# Patient Record
Sex: Female | Born: 2000
Health system: Southern US, Community
[De-identification: ages and names within clinical notes are randomized; demographics above are authoritative.]

---
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
## 2021-01-06 DIAGNOSIS — L03042 Acute lymphangitis of left toe: Secondary | ICD-10-CM | POA: Diagnosis not present

## 2021-01-13 DIAGNOSIS — L03042 Acute lymphangitis of left toe: Secondary | ICD-10-CM | POA: Diagnosis not present

## 2021-02-05 DIAGNOSIS — L600 Ingrowing nail: Secondary | ICD-10-CM | POA: Diagnosis not present

## 2021-02-05 DIAGNOSIS — M79676 Pain in unspecified toe(s): Secondary | ICD-10-CM | POA: Diagnosis not present

## 2021-02-19 DIAGNOSIS — M79676 Pain in unspecified toe(s): Secondary | ICD-10-CM | POA: Diagnosis not present

## 2021-03-10 DIAGNOSIS — K5900 Constipation, unspecified: Secondary | ICD-10-CM | POA: Diagnosis not present

## 2021-03-11 ENCOUNTER — Other Ambulatory Visit: Payer: Self-pay | Admitting: Gastroenterology

## 2021-03-11 ENCOUNTER — Ambulatory Visit
Admission: RE | Admit: 2021-03-11 | Discharge: 2021-03-11 | Disposition: A | Discharge status: Home / Self Care | Payer: Federal, State, Local not specified - PPO | Source: Ambulatory Visit | Attending: Gastroenterology | Admitting: Gastroenterology

## 2021-03-11 DIAGNOSIS — K5900 Constipation, unspecified: Secondary | ICD-10-CM

## 2021-03-11 DIAGNOSIS — R978 Other abnormal tumor markers: Secondary | ICD-10-CM | POA: Diagnosis not present

## 2021-03-14 ENCOUNTER — Other Ambulatory Visit: Payer: Self-pay | Admitting: Gastroenterology

## 2021-03-14 ENCOUNTER — Ambulatory Visit
Admission: RE | Admit: 2021-03-14 | Discharge: 2021-03-14 | Disposition: A | Discharge status: Home / Self Care | Payer: Federal, State, Local not specified - PPO | Source: Ambulatory Visit | Attending: Gastroenterology | Admitting: Gastroenterology

## 2021-03-14 DIAGNOSIS — K5900 Constipation, unspecified: Secondary | ICD-10-CM

## 2021-03-17 ENCOUNTER — Other Ambulatory Visit: Payer: Self-pay | Admitting: Gastroenterology

## 2021-03-17 ENCOUNTER — Ambulatory Visit
Admission: RE | Admit: 2021-03-17 | Discharge: 2021-03-17 | Disposition: A | Discharge status: Home / Self Care | Payer: Federal, State, Local not specified - PPO | Source: Ambulatory Visit | Attending: Gastroenterology | Admitting: Gastroenterology

## 2021-03-17 DIAGNOSIS — K5900 Constipation, unspecified: Secondary | ICD-10-CM

## 2021-05-14 DIAGNOSIS — K589 Irritable bowel syndrome without diarrhea: Secondary | ICD-10-CM | POA: Insufficient documentation

## 2021-09-01 ENCOUNTER — Encounter: Payer: Self-pay | Admitting: Nurse Practitioner

## 2021-09-01 ENCOUNTER — Ambulatory Visit: Payer: Federal, State, Local not specified - PPO | Admitting: Nurse Practitioner

## 2021-09-01 VITALS — BP 110/60 | HR 96 | Temp 98.0°F | Ht 67.0 in | Wt 125.4 lb

## 2021-09-01 DIAGNOSIS — R935 Abnormal findings on diagnostic imaging of other abdominal regions, including retroperitoneum: Secondary | ICD-10-CM | POA: Insufficient documentation

## 2021-09-01 DIAGNOSIS — R1033 Periumbilical pain: Secondary | ICD-10-CM

## 2021-09-01 LAB — CBC WITH DIFFERENTIAL/PLATELET
Basophils Absolute: 0 10*3/uL (ref 0.0–0.1)
Basophils Relative: 0.9 % (ref 0.0–3.0)
Eosinophils Absolute: 0.1 10*3/uL (ref 0.0–0.7)
Eosinophils Relative: 3.3 % (ref 0.0–5.0)
HCT: 40.5 % (ref 36.0–46.0)
Hemoglobin: 13.3 g/dL (ref 12.0–15.0)
Lymphocytes Relative: 29.9 % (ref 12.0–46.0)
Lymphs Abs: 1.3 10*3/uL (ref 0.7–4.0)
MCHC: 33 g/dL (ref 30.0–36.0)
MCV: 87.3 fl (ref 78.0–100.0)
Monocytes Absolute: 0.2 10*3/uL (ref 0.1–1.0)
Monocytes Relative: 5.7 % (ref 3.0–12.0)
Neutro Abs: 2.5 10*3/uL (ref 1.4–7.7)
Neutrophils Relative %: 60.2 % (ref 43.0–77.0)
Platelets: 235 10*3/uL (ref 150.0–400.0)
RBC: 4.64 Mil/uL (ref 3.87–5.11)
RDW: 13.2 % (ref 11.5–14.6)
WBC: 4.2 10*3/uL — ABNORMAL LOW (ref 4.5–10.5)

## 2021-09-01 NOTE — Progress Notes (Signed)
Established Patient Visit  Patient: Alison Terry   DOB: [birth year]   21 y.o. Female  MRN: [id number] Visit Date: 09/01/2021  Subjective:    Chief Complaint  Patient presents with   Office Visit    Hospital F/u from 08/28/21 Bloody stools, abd pain     HPI Abnormal abdominal CT scan Periportal edema per CT completed at [facility] and med [facility] 08/28/2021 She denies any ABD trauma. Has persistent intermittent ABD pain. No related to food intake nor BM. Resolved diarrhea, last BM 08/31/21 (normal, no blood). No fever/chills, no nausea.  Repeat hepatic panel, CBC, hep. A and B. Advised to schedule f/up with [facility]  Periumbilical abdominal pain ABD pain and diarrhea x 2days, then developed blood in stool. This led to an ED visit on 08/28/2021 in [place]. She was a family trip. Her sister developed diarrhea the day before her ED visit, but her symptoms are since resolved. I reviewed ED records today: CBC (WBC 8.5, H/H 12.9/3.8.7, neut 79.8, lymph 13.1), CMP (normal), Urinalysis (normal), HCG-negative, and CT ABD. She has a hx of chronic constipation, unable to tolerate linzess (med discontinued 74months ago), she is under the care of [facility].  Today she reports diarrhea and blood in stool has resolved, but has intermittent ABD pain. Advised to f/up with GI  Reviewed medical, surgical, and social history today  Medications: Outpatient Medications Prior to Visit  Medication Sig   cetirizine (ZYRTEC) 10 MG tablet Take 10 mg by mouth daily.   drospirenone-ethinyl estradiol (YAZ) 3-0.02 MG tablet Take 1 tablet by mouth daily.   PROAIR RESPICLICK 108 (90 Base) MCG/ACT AEPB Inhale into the lungs.   No facility-administered medications prior to visit.   Reviewed past medical and social history.   ROS per HPI above      Objective:  BP 110/60 (BP Location: Right Arm, Patient Position: Sitting, Cuff Size: Small)   Pulse 96   Temp 98 F (36.7  C) (Temporal)   Ht 5\' 7"  (1.702 m)   Wt 125 lb 6.4 oz (56.9 kg)   SpO2 (!) 86%   BMI 19.64 kg/m      Physical Exam Cardiovascular:     Rate and Rhythm: Normal rate and regular rhythm.     Pulses: Normal pulses.     Heart sounds: Normal heart sounds.  Pulmonary:     Effort: Pulmonary effort is normal.     Breath sounds: Normal breath sounds.  Abdominal:     General: Abdomen is flat. Bowel sounds are normal. There is no distension.     Palpations: Abdomen is soft.     Tenderness: There is no abdominal tenderness. There is no right CVA tenderness, left CVA tenderness, guarding or rebound.  Skin:    General: Skin is warm and dry.     Findings: No rash.  Neurological:     Mental Status: She is alert and oriented to person, place, and time.  Psychiatric:        Mood and Affect: Mood normal.        Behavior: Behavior normal.     No results found for any visits on 09/01/21.    Assessment & Plan:    Problem List Items Addressed This Visit       Other   Abnormal abdominal CT scan    Periportal edema per CT completed at [facility] and med [facility] 08/28/2021  She denies any ABD trauma. Has persistent intermittent ABD pain. No related to food intake nor BM. Resolved diarrhea, last BM 08/31/21 (normal, no blood). No fever/chills, no nausea.  Repeat hepatic panel, CBC, hep. A and B. Advised to schedule f/up with [facility] Phys[facility] GI      Relevant Orders   Hepatitis A antibody, IgM   Hepatitis B Core Antibody, IgM   Hepatic function panel   CBC with Differential/Platelet   Periumbilical abdominal pain - Primary    ABD pain and diarrhea x 2days, then developed blood in stool. This led to an ED visit on 08/28/2021 in [place]. She was a family trip. Her sister developed diarrhea the day before her ED visit, but her symptoms are since resolved. I reviewed ED records today: CBC (WBC 8.5, H/H 12.9/3.8.7, neut 79.8, lymph 13.1), CMP (normal), Urinalysis (normal), HCG-negative, and  CT ABD. She has a hx of chronic constipation, unable to tolerate linzess (med discontinued 2months ago), she is under the care of [facility].  Today she reports diarrhea and blood in stool has resolved, but has intermittent ABD pain. Advised to f/up with GI      Relevant Orders   Hepatitis A antibody, IgM   Hepatitis B Core Antibody, IgM   Hepatic function panel   CBC with Differential/Platelet   Return if symptoms worsen or fail to improve.     Alysia Penna, NP

## 2021-09-01 NOTE — Assessment & Plan Note (Addendum)
ABD pain and diarrhea x 2days, then developed blood in stool. This led to an ED visit on 08/28/2021 in [place]. She was a family trip. Her sister developed diarrhea the day before her ED visit, but her symptoms are since resolved. I reviewed ED records today: CBC (WBC 8.5, H/H 12.9/3.8.7, neut 79.8, lymph 13.1), CMP (normal), Urinalysis (normal), HCG-negative, and CT ABD. She has a hx of chronic constipation, unable to tolerate linzess (med discontinued 62months ago), she is under the care of [facility].  Today she reports diarrhea and blood in stool has resolved, but has intermittent ABD pain. Advised to f/up with GI

## 2021-09-01 NOTE — Assessment & Plan Note (Addendum)
Periportal edema per CT completed at [facility] and [facility] 08/28/2021 She denies any ABD trauma. Has persistent intermittent ABD pain. No related to food intake nor BM. Resolved diarrhea, last BM 08/31/21 (normal, no blood). No fever/chills, no nausea.  Repeat hepatic panel, CBC, hep. A and B. Advised to schedule f/up with [facility]

## 2021-09-01 NOTE — Patient Instructions (Signed)
Go to lab Schedule f/up appt with [facility]. Maintain low fat and high fiber diet.

## 2021-09-02 LAB — HEPATIC FUNCTION PANEL
ALT: 10 U/L (ref 0–35)
AST: 14 U/L (ref 0–37)
Albumin: 4.1 g/dL (ref 3.5–5.2)
Alkaline Phosphatase: 32 U/L — ABNORMAL LOW (ref 39–117)
Bilirubin, Direct: 0.1 mg/dL (ref 0.0–0.3)
Total Bilirubin: 0.5 mg/dL (ref 0.2–1.2)
Total Protein: 7 g/dL (ref 6.0–8.3)

## 2021-09-02 LAB — HEPATITIS A ANTIBODY, IGM: Hep A IgM: NONREACTIVE

## 2021-09-02 LAB — HEPATITIS B CORE ANTIBODY, IGM: Hep B C IgM: NONREACTIVE

## 2021-09-10 DIAGNOSIS — R197 Diarrhea, unspecified: Secondary | ICD-10-CM | POA: Diagnosis not present

## 2021-09-10 DIAGNOSIS — K589 Irritable bowel syndrome without diarrhea: Secondary | ICD-10-CM | POA: Diagnosis not present

## 2021-09-10 DIAGNOSIS — K5901 Slow transit constipation: Secondary | ICD-10-CM | POA: Diagnosis not present

## 2021-09-10 DIAGNOSIS — K625 Hemorrhage of anus and rectum: Secondary | ICD-10-CM | POA: Diagnosis not present

## 2021-09-19 DIAGNOSIS — K648 Other hemorrhoids: Secondary | ICD-10-CM | POA: Diagnosis not present

## 2021-09-19 DIAGNOSIS — K625 Hemorrhage of anus and rectum: Secondary | ICD-10-CM | POA: Diagnosis not present

## 2021-09-19 DIAGNOSIS — R194 Change in bowel habit: Secondary | ICD-10-CM | POA: Diagnosis not present

## 2021-09-19 LAB — HM COLONOSCOPY

## 2021-10-01 DIAGNOSIS — R194 Change in bowel habit: Secondary | ICD-10-CM | POA: Diagnosis not present

## 2021-10-16 DIAGNOSIS — L603 Nail dystrophy: Secondary | ICD-10-CM | POA: Diagnosis not present

## 2021-11-04 DIAGNOSIS — N946 Dysmenorrhea, unspecified: Secondary | ICD-10-CM | POA: Diagnosis not present

## 2021-11-04 DIAGNOSIS — Z681 Body mass index (BMI) 19 or less, adult: Secondary | ICD-10-CM | POA: Diagnosis not present

## 2021-11-04 DIAGNOSIS — Z01419 Encounter for gynecological examination (general) (routine) without abnormal findings: Secondary | ICD-10-CM | POA: Diagnosis not present

## 2022-04-20 DIAGNOSIS — K1370 Unspecified lesions of oral mucosa: Secondary | ICD-10-CM | POA: Diagnosis not present

## 2022-06-22 IMAGING — DX DG ABDOMEN 1V
1 series · 1 of 1 positions shown · non-contrast
Comparison: 03/11/2021

CLINICAL DATA: Follow-up Sitz marker study

EXAM:
ABDOMEN - 1 VIEW

[dg abd 1 view]
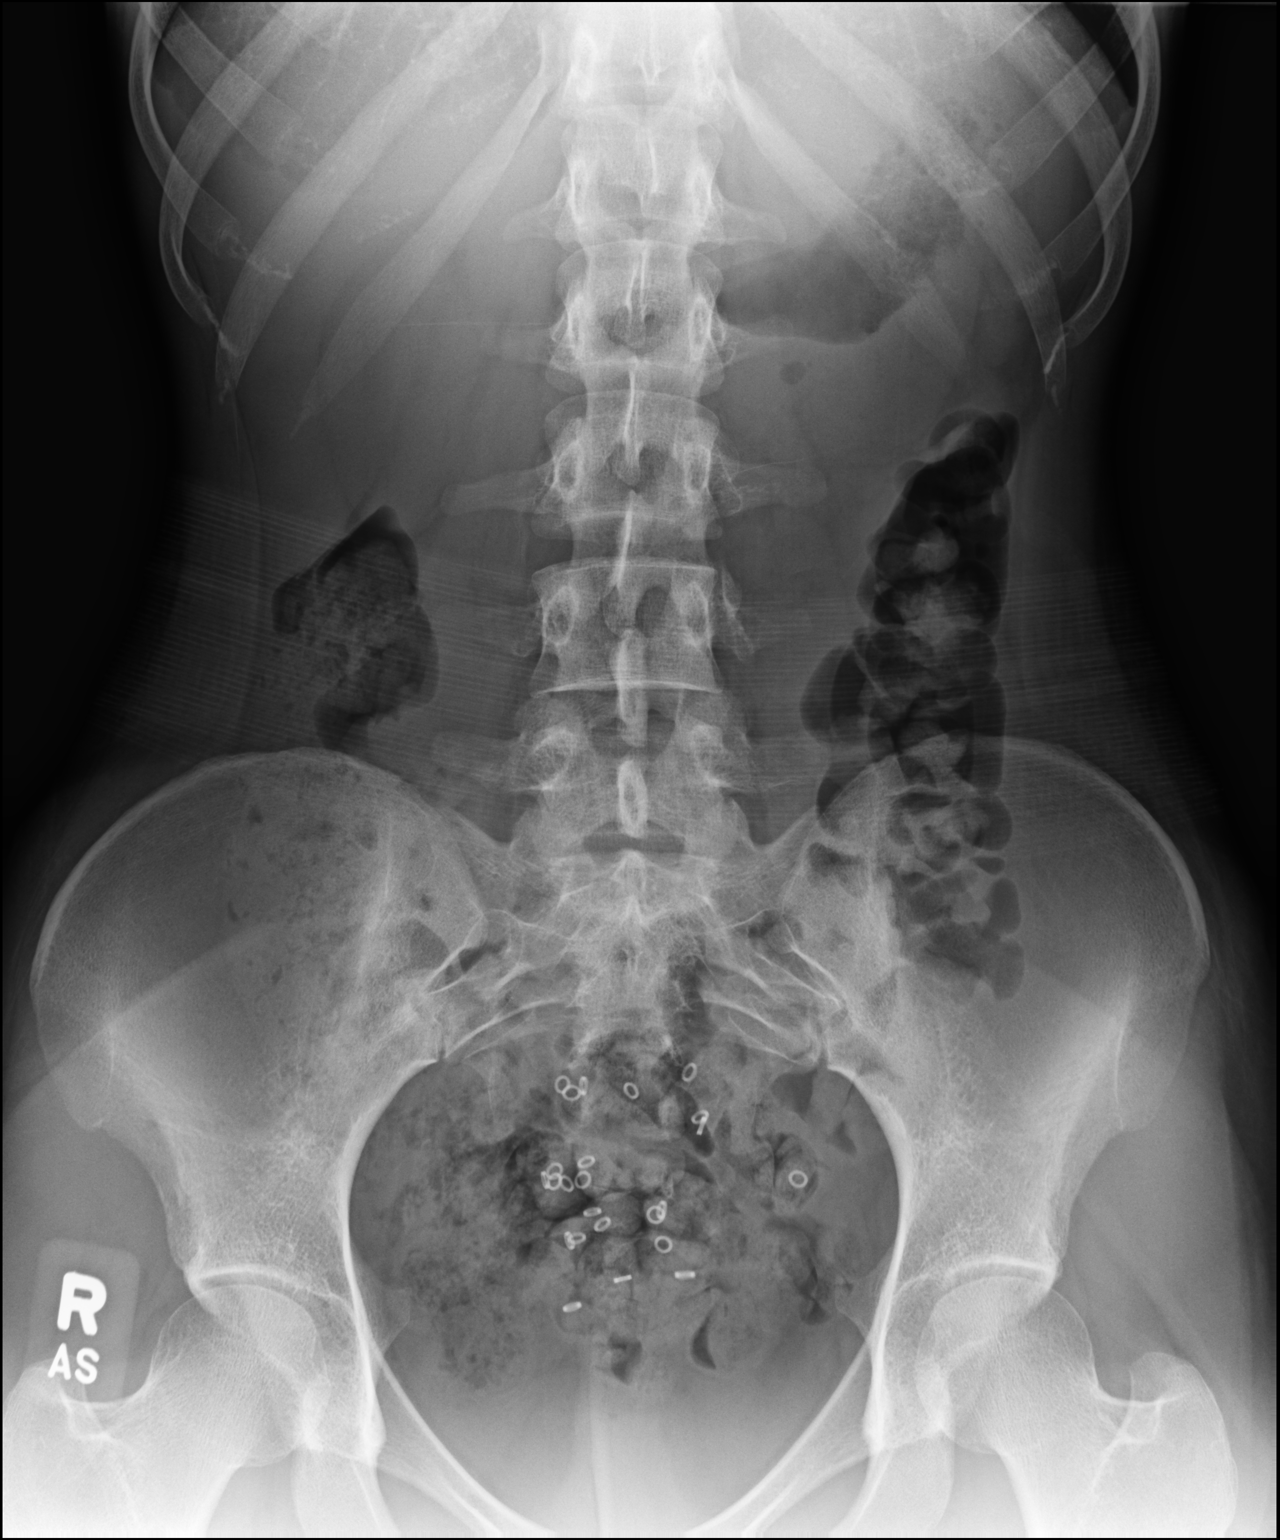

[1 of 1 positions shown; findings below may reference images not displayed]

FINDINGS: Scattered large and small bowel gas is noted. Fecal material is
noted within the colon consistent with a mild degree of
constipation. Twenty-four Sitz markers are noted centrally within
the pelvis likely within the distal colon given their clumping
centrally.
IMPRESSION: Sitz markers are noted centrally within the pelvis likely in the
distal colon. Follow-up as clinically indicated.

Retained fecal material consistent with a degree of constipation.

## 2022-07-16 ENCOUNTER — Ambulatory Visit: Payer: Federal, State, Local not specified - PPO | Admitting: Nurse Practitioner

## 2022-07-16 ENCOUNTER — Encounter: Payer: Self-pay | Admitting: Nurse Practitioner

## 2022-07-16 VITALS — BP 102/60 | HR 100 | Temp 98.4°F | Resp 18 | Ht 67.0 in | Wt 124.8 lb

## 2022-07-16 DIAGNOSIS — Z23 Encounter for immunization: Secondary | ICD-10-CM | POA: Diagnosis not present

## 2022-07-16 DIAGNOSIS — K529 Noninfective gastroenteritis and colitis, unspecified: Secondary | ICD-10-CM | POA: Insufficient documentation

## 2022-07-16 NOTE — Patient Instructions (Addendum)
Go to lab for stool kit Return stool sample to lab as soon as possible Use Peptobismol prn after stool collection Maintain BRAT diet and adequate oral hydration Call office if no improvement in 2weeks  Bland Diet A bland diet may consist of soft foods or foods that are not high in fat or are not greasy, acidic, or spicy. Avoiding certain foods may cause less irritation to your mouth, throat, stomach, or gastrointestinal tract. Avoiding certain foods may make you feel better. Everyone's tolerances are different. A bland diet should be based on what you can tolerate and what may cause discomfort. What is my plan? Your health care provider or dietitian may recommend specific changes to your diet to treat your symptoms. These changes may include: Eating small meals frequently. Cooking food until it is soft enough to chew easily. Taking the time to chew your food thoroughly, so it is easy to swallow and digest. Avoiding foods that cause you discomfort. These may include spicy food, fried food, greasy foods, hard-to-chew foods, or citrus fruits and juices. Drinking slowly. What are tips for following this plan? Reading food labels To reduce fiber intake, look for food labels that say "whole," such as whole wheat or whole grain. Shopping Avoid food items that may have nuts or seeds. Avoid vegetables that may make you gassy or have a tough texture, such as broccoli, cauliflower, or corn. Cooking Cook foods thoroughly so they have a soft texture. Meal planning Make sure you include foods from all food groups to eat a balanced diet. Eat a variety of types of foods. Eat foods and drink beverages that do not cause you discomfort. These may include soups and broths with cooked meats, pasta, and vegetables. Lifestyle Sit up after meals, avoid tight clothing, and take time to eat and chew your food slowly. Ask your health care provider whether you should take dietary supplements. General  information Mildly season your foods. Some seasonings, such as cayenne pepper, vinegar, or hot sauce, may cause irritation. The foods, beverages, or seasonings to avoid should be based on individual tolerance. What foods should I eat? Fruits Canned or cooked fruit such as peaches, pears, or applesauce. Bananas. Vegetables Well-cooked vegetables. Canned or cooked vegetables such as carrots, green beans, beets, or spinach. Mashed or boiled potatoes. Grains  Hot cereals, such as cream of wheat and processed oatmeal. Rice. Bread, crackers, pasta, or tortillas made from refined white flour. Meats and other proteins  Eggs. Creamy peanut butter or other nut butters. Lean, well-cooked tender meats, such as beef, pork, chicken, or fish. Dairy Low-fat dairy products such as milk, cottage cheese, or yogurt. Beverages  Water. Herbal tea. Apple juice. Fats and oils Mild salad dressings. Canola or olive oil. Sweets and desserts Low-fat pudding, custard, or ice cream. Fruit gelatin. The items listed above may not be a complete list of foods and beverages you can eat. Contact a dietitian for more information. What foods should I avoid? Fruits Citrus fruits, such as oranges and grapefruit. Fruits with a stringy texture. Fruits that have lots of seeds, such as kiwi or strawberries. Dried fruits. Vegetables Raw, uncooked vegetables. Salads. Grains Whole grain breads, muffins, and cereals. Meats and other proteins Tough, fibrous meats. Highly seasoned meat such as corned beef, smoked meats, or fish. Processed high-fat meats such as brats, hot dogs, or sausage. Dairy Full-fat dairy foods such as ice cream and cheese. Beverages Caffeinated drinks. Alcohol. Seasonings and condiments Strongly flavored seasonings or condiments. Hot sauce. Salsa. Other foods  Spicy foods. Fried or greasy foods. Sour foods, such as pickled or fermented foods like sauerkraut. Foods high in fiber. The items listed above  may not be a complete list of foods and beverages you should avoid. Contact a dietitian for more information. Summary A bland diet should be based on individual tolerance. It may consist of foods that are soft textured and do not have a lot of fat, fiber, acid, or seasonings. A bland diet may be recommended because avoiding certain foods, beverages, or spices may make you feel better. This information is not intended to replace advice given to you by your health care provider. Make sure you discuss any questions you have with your health care provider. Document Revised: 12/02/2020 Document Reviewed: 12/02/2020 Elsevier Patient Education  2024 [employer].

## 2022-07-16 NOTE — Progress Notes (Signed)
Established Patient Visit  Patient: Alison Terry   DOB: 2000-07-14   21 y.o. Female  MRN: [id number] Visit Date: 07/16/2022  Subjective:    Chief Complaint  Patient presents with   GI issues    Pt says she has had some diarrhea that looked different since Sunday 07/12/22. She has a hx of IBS, but her sxs seem abnormal. The color is yellow and her pain is in the upper region.  Update immunizations   Diarrhea  This is a new problem. The current episode started in the past 7 days. The problem occurs 2 to 4 times per day. The problem has been gradually improving. The stool consistency is described as Watery. The patient states that diarrhea does not awaken her from sleep. Associated symptoms include abdominal pain and bloating. Pertinent negatives include no arthralgias, chills, coughing, fever, headaches, increased  flatus, myalgias, sweats, URI, vomiting or weight loss. Exacerbated by: unknown. She has tried nothing for the symptoms. Her past medical history is significant for irritable bowel syndrome. There is no history of bowel resection, inflammatory bowel disease, malabsorption, a recent abdominal surgery or short gut syndrome.   On Sunday, Stool contained undigested lettuce consumed 2hrs prior. Started with 5stools daily x 2days, now has 1 loose stool daily. Last colonoscopy [facility] 08/2021: normal Completed clindamycin x 7days in March for dental infection She started low FODMAP diet 2days ago. She stopped hyoscyamine tabs  and align probiotic because they were ineffective  Wt Readings from Last 3 Encounters:  07/16/22 124 lb 12.8 oz (56.6 kg)  09/01/21 125 lb 6.4 oz (56.9 kg)  08/31/19 112 lb (50.8 kg) (24 %, Z= -0.71)*   * Growth percentiles are based on CDC (Girls, 2-20 Years) data.    Reviewed medical, surgical, and social history today  Medications: Outpatient Medications Prior to Visit  Medication Sig   cetirizine (ZYRTEC) 10 MG tablet Take 10 mg by  mouth daily.   drospirenone-ethinyl estradiol (YAZ) 3-0.02 MG tablet Take 1 tablet by mouth daily.   [DISCONTINUED] PROAIR RESPICLICK 108 (90 Base) MCG/ACT AEPB Inhale into the lungs.   No facility-administered medications prior to visit.   Reviewed past medical and social history.   ROS per HPI above      Objective:  BP 102/60 (BP Location: Left Arm, Patient Position: Sitting, Cuff Size: Normal)   Pulse 100   Temp 98.4 F (36.9 C) (Temporal)   Resp 18   Ht 5\' 7"  (1.702 m)   Wt 124 lb 12.8 oz (56.6 kg)   SpO2 99%   BMI 19.55 kg/m      Physical Exam Vitals and nursing note reviewed.  Eyes:     General: No scleral icterus. Cardiovascular:     Rate and Rhythm: Normal rate.     Pulses: Normal pulses.  Pulmonary:     Effort: Pulmonary effort is normal.  Abdominal:     General: Bowel sounds are normal. There is no distension.     Palpations: Abdomen is soft.     Tenderness: There is no abdominal tenderness. There is no right CVA tenderness, left CVA tenderness or guarding.  Skin:    General: Skin is warm and dry.     Coloration: Skin is not jaundiced.  Neurological:     Mental Status: She is oriented to person, place, and time.     No results found for any visits on 07/16/22.  Assessment & Plan:    Problem List Items Addressed This Visit       Digestive   Gastroenteritis - Primary   Relevant Orders   C. difficile GDH and Toxin A/B   Other Visit Diagnoses     Immunization due       Relevant Orders   Tdap vaccine greater than or equal to 7yo IM (Completed)     Food poisoning vs viral gastroenteritis vs c.diff? Go to lab for stool kit Return stool sample to lab as soon as possible Use peptobismol prn after stool collection Maintain BRAT diet and adequate oral hydration Call office if no improvement in 2weeks  Return in about 2 months (around 09/15/2022) for CPE (fasting).     Alysia Penna, NP

## 2022-07-17 ENCOUNTER — Other Ambulatory Visit: Payer: Self-pay

## 2022-07-17 ENCOUNTER — Other Ambulatory Visit: Payer: Federal, State, Local not specified - PPO

## 2022-07-17 DIAGNOSIS — K529 Noninfective gastroenteritis and colitis, unspecified: Secondary | ICD-10-CM | POA: Diagnosis not present

## 2022-07-17 DIAGNOSIS — A0472 Enterocolitis due to Clostridium difficile, not specified as recurrent: Secondary | ICD-10-CM

## 2022-07-17 NOTE — Addendum Note (Signed)
Addended by: Lindley Magnus L on: 07/17/2022 03:08 PM   Modules accepted: Orders

## 2022-07-17 NOTE — Addendum Note (Signed)
Addended by: Lindley Magnus L on: 07/17/2022 02:22 PM   Modules accepted: Orders

## 2022-07-20 ENCOUNTER — Telehealth: Payer: Self-pay

## 2022-07-20 LAB — CLOSTRIDIUM DIFFICILE TOXIN B, QUALITATIVE, REAL-TIME PCR: Toxigenic C. Difficile by PCR: DETECTED — AB

## 2022-07-20 LAB — C. DIFFICILE GDH AND TOXIN A/B
GDH ANTIGEN: DETECTED
MICRO NUMBER:: 15112642
SPECIMEN QUALITY:: ADEQUATE
TOXIN A AND B: NOT DETECTED

## 2022-07-20 NOTE — Telephone Encounter (Signed)
[name] called to advise [place] that she is positive for C-Diff

## 2022-07-21 MED ORDER — VANCOMYCIN HCL 125 MG PO CAPS
125.0000 mg | ORAL_CAPSULE | Freq: Four times a day (QID) | ORAL | 0 refills | Status: AC
Start: 2022-07-21 — End: 2022-07-31

## 2022-07-21 NOTE — Addendum Note (Signed)
Addended by: Alysia Penna L on: 07/21/2022 11:50 AM   Modules accepted: Orders

## 2022-08-03 ENCOUNTER — Ambulatory Visit: Payer: Federal, State, Local not specified - PPO | Admitting: Family Medicine

## 2022-08-03 VITALS — BP 110/70 | HR 84 | Temp 98.7°F | Wt 123.4 lb

## 2022-08-03 DIAGNOSIS — A0472 Enterocolitis due to Clostridium difficile, not specified as recurrent: Secondary | ICD-10-CM

## 2022-08-03 MED ORDER — FIDAXOMICIN 200 MG PO TABS
200.0000 mg | ORAL_TABLET | Freq: Two times a day (BID) | ORAL | 0 refills | Status: AC
Start: 2022-08-03 — End: 2022-08-13

## 2022-08-03 MED ORDER — ONDANSETRON HCL 4 MG PO TABS: 4.0000 mg | ORAL_TABLET | Freq: Three times a day (TID) | ORAL | 0 refills | Status: DC | PRN

## 2022-08-03 NOTE — Progress Notes (Unsigned)
Assessment/Plan:   Problem List Items Addressed This Visit   None   There are no discontinued medications.  No follow-ups on file.    Subjective:   Encounter date: 08/03/2022  Alison Terry is a 22 y.o. female who has Menorrhagia with regular cycle; Constipation; Abnormal abdominal CT scan; Periumbilical abdominal pain; IBS (irritable bowel syndrome); and Gastroenteritis on their problem list..   She  has no past medical history on file..   She presents with chief complaint of Medical Management of Chronic Issues (Positve for C-Diff 6/24.  Still having symptoms. Completed vancomycin) .   HPI:   ROS  No past surgical history on file.  Outpatient Medications Prior to Visit  Medication Sig Dispense Refill   cetirizine (ZYRTEC) 10 MG tablet Take 10 mg by mouth daily.     drospirenone-ethinyl estradiol (YAZ) 3-0.02 MG tablet Take 1 tablet by mouth daily.     No facility-administered medications prior to visit.    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   Home with parents   Social Determinants of Health   Financial Resource Strain: Low Risk  (08/03/2022)   Overall Financial Resource Strain (CARDIA)    Difficulty of Paying Living Expenses: Not very hard  Food Insecurity: No Food Insecurity (08/03/2022)   Hunger Vital Sign    Worried About Running  Out of Food in the Last Year: Never true    Ran Out of Food in the Last Year: Never true  Transportation Needs: No Transportation Needs (08/03/2022)   PRAPARE - Administrator, Civil Service (Medical): No    Lack of Transportation (Non-Medical): No  Physical Activity: Insufficiently Active (08/03/2022)   Exercise Vital Sign    Days of Exercise per Week: 3 days    Minutes of Exercise per Session: 20 min  Stress: Stress Concern Present (08/03/2022)   [employer] of Occupational Health - Occupational Stress Questionnaire    Feeling of Stress : Very much  Social Connections: Socially Isolated (08/03/2022)   Social Connection and Isolation Panel [NHANES]    Frequency of Communication with Friends and Family: More than three times a week    Frequency of Social Gatherings with Friends and Family: Once a week    Attends Religious Services: Never    Database administrator or Organizations: No    Attends Engineer, structural: Not on file    Marital Status: Never married  Intimate Partner Violence: Not on file  Objective:  Physical Exam: BP 110/70 (BP Location: Left Arm, Patient Position: Sitting, Cuff Size: Large)   Pulse 84   Temp 98.7 F (37.1 C) (Temporal)   Wt 123 lb 6.4 oz (56 kg)   LMP 07/14/2022   SpO2 99%   BMI 19.33 kg/m   Wt Readings from Last 3 Encounters:  08/03/22 123 lb 6.4 oz (56 kg)  07/16/22 124 lb 12.8 oz (56.6 kg)  09/01/21 125 lb 6.4 oz (56.9 kg)     Physical Exam  No results found.  Recent Results (from the past 2160 hour(s))  C. difficile GDH and Toxin A/B     Status: None   Collection Time: 07/17/22  3:08 PM  Result Value Ref Range   MICRO NUMBER: [id number]    SPECIMEN QUALITY: Adequate    Source STOOL    STATUS: FINAL    GDH ANTIGEN Detected     Comment: Detected   TOXIN A AND B Not Detected    COMMENT      Indeterminate. Specimen  forwarded for toxigenic C. difficile PCR testing. For additional information, please refer to http://education.[facility].com/faq/FAQ136 (This link is being provided for informational/educational purposes only.)  Clostridium difficile Toxin B, Qualitative, Real-Time PCR     Status: Abnormal   Collection Time: 07/17/22  3:08 PM  Result Value Ref Range   Toxigenic C. Difficile by PCR DETECTED (A) NOT DETECTED    Comment: . The stool sample is POSITIVE for toxigenic C. difficile.  This result is suggestive of C. difficile infection  (CDI) if accompanied by appropriate clinical symptoms. . Simultaneous testing does not identify a genetic marker of the hypervirulent 027/NAP1/BI strain of toxigenic  C. difficile. . This test is for use only with liquid or soft stools;  performance characteristics of other clinical specimen  types have not been established. . This assay was performed by Cepheid GeneXpert(R) PCR. The performance characteristics of this assay have been determined by [employer]. Performance characteristics refer to the analytical performance of the test. . For additional information, please refer to http://education.[facility].com/faq/FAQ136 (This link is being provided for informational/educational purposes only.) .         Garner Nash, MD, MS

## 2022-08-03 NOTE — Patient Instructions (Signed)
Start fidaxomicin: Take 1 pill twice a day for 10 days. Hydration: Maintain adequate hydration, especially if diarrhea persists. Zofran: Use as needed for nausea. Contact your gastroenterologist: Inform them about the change in treatment to fidaxomicin and the ongoing symptoms. Monitor Symptoms: If symptoms worsen, such as profuse bleeding, high fever, or severe abdominal pain, go to the emergency department immediately.

## 2022-08-04 DIAGNOSIS — A0472 Enterocolitis due to Clostridium difficile, not specified as recurrent: Secondary | ICD-10-CM | POA: Insufficient documentation

## 2022-08-04 NOTE — Assessment & Plan Note (Signed)
Ongoing symptoms of C. diff infection despite treatment. Persistent diarrhea, cramping, and presence of blood in stool suggest the need for alternative treatment.  Differential diagnosis:  Persistent C. difficile infection Post-infectious irritable bowel syndrome (PI-IBS) Antibiotic-associated colitis Plan:  Initiate treatment with fidaxomicin 200 mg twice daily for 10 days. Hydration is emphasized. Prescribe Zofran for nausea as needed. Advise against the use of antidiarrheals. Follow-up with a gastroenterologist, at [facility]. Provide a note for work delay considering the highly infectious nature of C. diff and potential for severe illness. Educate on recognizing signs of severe complications (profuse bleeding, high fever, severe nausea, and vomiting) and recommend immediate ER visit if these occur.

## 2022-08-20 ENCOUNTER — Encounter: Payer: Federal, State, Local not specified - PPO | Admitting: Nurse Practitioner

## 2022-09-29 DIAGNOSIS — K589 Irritable bowel syndrome without diarrhea: Secondary | ICD-10-CM | POA: Diagnosis not present

## 2022-09-29 DIAGNOSIS — Z8619 Personal history of other infectious and parasitic diseases: Secondary | ICD-10-CM | POA: Diagnosis not present

## 2022-11-10 DIAGNOSIS — Z01419 Encounter for gynecological examination (general) (routine) without abnormal findings: Secondary | ICD-10-CM | POA: Diagnosis not present

## 2022-11-10 DIAGNOSIS — N760 Acute vaginitis: Secondary | ICD-10-CM | POA: Diagnosis not present

## 2022-11-10 DIAGNOSIS — N94810 Vulvar vestibulitis: Secondary | ICD-10-CM | POA: Diagnosis not present

## 2022-11-10 DIAGNOSIS — Z113 Encounter for screening for infections with a predominantly sexual mode of transmission: Secondary | ICD-10-CM | POA: Diagnosis not present

## 2022-11-10 DIAGNOSIS — Z124 Encounter for screening for malignant neoplasm of cervix: Secondary | ICD-10-CM | POA: Diagnosis not present

## 2022-11-27 ENCOUNTER — Telehealth: Payer: Self-pay | Admitting: Nurse Practitioner

## 2022-11-27 NOTE — Telephone Encounter (Signed)
Patient is requesting TOC from [facility] to Dr. Okey Dupre. Please advise.

## 2022-11-30 NOTE — Telephone Encounter (Signed)
Ok

## 2022-12-02 DIAGNOSIS — N94810 Vulvar vestibulitis: Secondary | ICD-10-CM | POA: Diagnosis not present

## 2022-12-08 NOTE — Telephone Encounter (Signed)
Patient would like to reschedule their appointment. The next new patient appointment for Dr. Okey Dupre will be in May. Can this patient be seen sooner? Best callback is [phone].

## 2022-12-14 ENCOUNTER — Ambulatory Visit (INDEPENDENT_AMBULATORY_CARE_PROVIDER_SITE_OTHER): Payer: Federal, State, Local not specified - PPO

## 2022-12-14 ENCOUNTER — Encounter: Payer: Self-pay | Admitting: Internal Medicine

## 2022-12-14 ENCOUNTER — Ambulatory Visit: Payer: Federal, State, Local not specified - PPO | Admitting: Internal Medicine

## 2022-12-14 VITALS — BP 112/66 | HR 89 | Temp 98.8°F | Ht 67.0 in | Wt 125.0 lb

## 2022-12-14 DIAGNOSIS — R197 Diarrhea, unspecified: Secondary | ICD-10-CM | POA: Diagnosis not present

## 2022-12-14 DIAGNOSIS — R5383 Other fatigue: Secondary | ICD-10-CM

## 2022-12-14 DIAGNOSIS — K582 Mixed irritable bowel syndrome: Secondary | ICD-10-CM | POA: Diagnosis not present

## 2022-12-14 DIAGNOSIS — A0472 Enterocolitis due to Clostridium difficile, not specified as recurrent: Secondary | ICD-10-CM

## 2022-12-14 DIAGNOSIS — K5909 Other constipation: Secondary | ICD-10-CM | POA: Diagnosis not present

## 2022-12-14 DIAGNOSIS — R109 Unspecified abdominal pain: Secondary | ICD-10-CM | POA: Diagnosis not present

## 2022-12-14 DIAGNOSIS — R110 Nausea: Secondary | ICD-10-CM | POA: Diagnosis not present

## 2022-12-14 DIAGNOSIS — K5900 Constipation, unspecified: Secondary | ICD-10-CM

## 2022-12-14 LAB — CBC
HCT: 42.2 % (ref 36.0–46.0)
Hemoglobin: 13.7 g/dL (ref 12.0–15.0)
MCHC: 32.5 g/dL (ref 30.0–36.0)
MCV: 87.8 fL (ref 78.0–100.0)
Platelets: 254 10*3/uL (ref 150.0–400.0)
RBC: 4.81 Mil/uL (ref 3.87–5.11)
RDW: 13 % (ref 11.5–15.5)
WBC: 6.3 10*3/uL (ref 4.0–10.5)

## 2022-12-14 LAB — FERRITIN: Ferritin: 14.8 ng/mL (ref 10.0–291.0)

## 2022-12-14 LAB — T4, FREE: Free T4: 1.08 ng/dL (ref 0.60–1.60)

## 2022-12-14 LAB — TSH: TSH: 2.23 u[IU]/mL (ref 0.35–5.50)

## 2022-12-14 NOTE — Progress Notes (Unsigned)
   Subjective:   Patient ID: Alison Terry, female    DOB: 03-22-2000, 22 y.o.   MRN: [id number]  HPI The patient is a 22 YO TOC coming in for multiple concerns mostly GI concerns and fatigue. She had C dif earlier this year and has not recovered well. Saw GI recently and no change to treatment. Pain and bloating and diarrhea still. Has made dietary changes which have not helped. Fatigue is worse in the last year.  PMH, [facility], social history reviewed and updated  Review of Systems  Constitutional:  Positive for fatigue.  HENT: Negative.    Eyes: Negative.   Respiratory:  Negative for cough, chest tightness and shortness of breath.   Cardiovascular:  Negative for chest pain, palpitations and leg swelling.  Gastrointestinal:  Positive for abdominal distention, abdominal pain, constipation and diarrhea. Negative for nausea and vomiting.  Musculoskeletal: Negative.   Skin: Negative.   Neurological: Negative.   Psychiatric/Behavioral: Negative.      Objective:  Physical Exam Constitutional:      Appearance: She is well-developed.  HENT:     Head: Normocephalic and atraumatic.  Cardiovascular:     Rate and Rhythm: Normal rate and regular rhythm.  Pulmonary:     Effort: Pulmonary effort is normal. No respiratory distress.     Breath sounds: Normal breath sounds. No wheezing or rales.  Abdominal:     General: Bowel sounds are normal. There is no distension.     Palpations: Abdomen is soft.     Tenderness: There is abdominal tenderness. There is no rebound.  Musculoskeletal:     Cervical back: Normal range of motion.  Skin:    General: Skin is warm and dry.  Neurological:     Mental Status: She is alert and oriented to person, place, and time.     Coordination: Coordination normal.     Vitals:   12/14/22 1501  BP: 112/66  Pulse: 89  Temp: 98.8 F (37.1 C)  TempSrc: Oral  SpO2: 96%  Weight: 125 lb (56.7 kg)  Height: 5\' 7"  (1.702 m)    Assessment & Plan:  Visit time 25  minutes in face to face communication with patient and coordination of care, additional 20 minutes spent in record review, coordination or care, ordering tests, communicating/referring to other healthcare professionals, documenting in medical records all on the same day of the visit for total time 45 minutes spent on the visit.

## 2022-12-14 NOTE — Patient Instructions (Signed)
We will check the labs and the x-ray today.

## 2022-12-15 DIAGNOSIS — R5383 Other fatigue: Secondary | ICD-10-CM | POA: Insufficient documentation

## 2022-12-15 LAB — VITAMIN D 25 HYDROXY (VIT D DEFICIENCY, FRACTURES): VITD: 15.03 ng/mL — ABNORMAL LOW (ref 30.00–100.00)

## 2022-12-15 LAB — COMPREHENSIVE METABOLIC PANEL
ALT: 14 U/L (ref 0–35)
AST: 18 U/L (ref 0–37)
Albumin: 4.4 g/dL (ref 3.5–5.2)
Alkaline Phosphatase: 37 U/L — ABNORMAL LOW (ref 39–117)
BUN: 10 mg/dL (ref 6–23)
CO2: 21 meq/L (ref 19–32)
Calcium: 9.6 mg/dL (ref 8.4–10.5)
Chloride: 103 meq/L (ref 96–112)
Creatinine, Ser: 0.88 mg/dL (ref 0.40–1.20)
GFR: 93.95 mL/min (ref >60.00)
Glucose, Bld: 100 mg/dL — ABNORMAL HIGH (ref 70–99)
Potassium: 3.8 meq/L (ref 3.5–5.1)
Sodium: 136 meq/L (ref 135–145)
Total Bilirubin: 0.6 mg/dL (ref 0.2–1.2)
Total Protein: 7.2 g/dL (ref 6.0–8.3)

## 2022-12-15 LAB — VITAMIN B12: Vitamin B-12: 206 pg/mL — ABNORMAL LOW (ref 211–911)

## 2022-12-15 NOTE — Assessment & Plan Note (Signed)
She has struggled with this all her life and since C dif has had diarrhea and constipation issues.

## 2022-12-15 NOTE — Assessment & Plan Note (Signed)
Seems to have resolved this but new symptoms in last 2 days. If not improving may need repeat testing.

## 2022-12-15 NOTE — Assessment & Plan Note (Addendum)
Suspect nutritional deficiency from poor absorption during C dif and checking CMP, CBC, vitamin B12 D and ferritin and TSH and free T4 to assess cause.

## 2022-12-15 NOTE — Assessment & Plan Note (Signed)
Sounds to be either constipation overflow with her pattern of liquid stools and prior constipation. No abdomen x-ray since onset of these new symptoms. She also had new liquid stools in last 2 days. Depending on results of labs and x-ray may need repeat C dif or stool pathogen testing.

## 2022-12-18 ENCOUNTER — Ambulatory Visit: Payer: Federal, State, Local not specified - PPO | Admitting: Internal Medicine

## 2022-12-18 ENCOUNTER — Encounter: Payer: Federal, State, Local not specified - PPO | Admitting: Internal Medicine

## 2022-12-18 ENCOUNTER — Other Ambulatory Visit: Payer: Self-pay | Admitting: Internal Medicine

## 2022-12-18 MED ORDER — VITAMIN D (ERGOCALCIFEROL) 1.25 MG (50000 UNIT) PO CAPS: 50000.0000 [IU] | ORAL_CAPSULE | ORAL | 0 refills | Status: AC

## 2022-12-18 NOTE — Telephone Encounter (Signed)
Patient would like a script for the B12 send to [facility] - [address] [place] - she wants them to give her her shot.  She wants to get before the 2nd.  Please call patient and advise.

## 2022-12-21 ENCOUNTER — Encounter: Payer: Self-pay | Admitting: Internal Medicine

## 2022-12-21 NOTE — Telephone Encounter (Signed)
Patient dose want to get b12 shots done but she wants you to send them in to her local pharmacy to have them done there

## 2022-12-24 ENCOUNTER — Encounter: Payer: Self-pay | Admitting: Internal Medicine

## 2022-12-24 DIAGNOSIS — R197 Diarrhea, unspecified: Secondary | ICD-10-CM

## 2022-12-24 DIAGNOSIS — Z8619 Personal history of other infectious and parasitic diseases: Secondary | ICD-10-CM

## 2022-12-28 ENCOUNTER — Ambulatory Visit (INDEPENDENT_AMBULATORY_CARE_PROVIDER_SITE_OTHER): Payer: Federal, State, Local not specified - PPO

## 2022-12-28 ENCOUNTER — Other Ambulatory Visit: Payer: Self-pay | Admitting: Internal Medicine

## 2022-12-28 DIAGNOSIS — E538 Deficiency of other specified B group vitamins: Secondary | ICD-10-CM

## 2022-12-28 MED ORDER — CYANOCOBALAMIN 1000 MCG/ML IJ SOLN
1000.0000 ug | Freq: Once | INTRAMUSCULAR | Status: AC
  Administered 2022-12-28: 1000 ug via INTRAMUSCULAR

## 2022-12-28 MED ORDER — SYRINGE 25G X 1-1/2" 3 ML MISC: 0 refills | Status: AC

## 2022-12-28 MED ORDER — CYANOCOBALAMIN 1000 MCG/ML IJ SOLN: 1000.0000 ug | INTRAMUSCULAR | 0 refills | Status: AC

## 2022-12-28 NOTE — Progress Notes (Signed)
 PT visits today for their b-12 injection. PT informed of what they were receiving and tolerated injection well. PT notified to reach out to office if needed.

## 2022-12-30 DIAGNOSIS — N94810 Vulvar vestibulitis: Secondary | ICD-10-CM | POA: Diagnosis not present

## 2023-01-08 ENCOUNTER — Other Ambulatory Visit: Payer: Self-pay | Admitting: Internal Medicine

## 2023-01-12 ENCOUNTER — Other Ambulatory Visit: Payer: Federal, State, Local not specified - PPO

## 2023-01-14 ENCOUNTER — Telehealth: Payer: Self-pay

## 2023-01-14 NOTE — Telephone Encounter (Signed)
Copied from CRM [id number]. Topic: Clinical - Medication Question >> Jan 14, 2023 10:27 AM Sasha H wrote: Reason for CRM: Pt would like to also know if its ok that she went past her due date for her injection.

## 2023-01-14 NOTE — Telephone Encounter (Signed)
Copied from CRM [id number]. Topic: Clinical - Medication Question >> Jan 14, 2023 10:24 AM Sasha H wrote: Reason for CRM: pt states she is now behind on her cyanocobalamin (VITAMIN B12) 1000 MCG/ML injection and is wondering when she can come in and get it. States she reached out days ago and still hasn't heard anything. Would like a call back to be scheduled.

## 2023-01-14 NOTE — Telephone Encounter (Signed)
Patient has been prescribed to do her own b12 shots please advise if patient can still do her b12 since it was late.

## 2023-01-18 ENCOUNTER — Ambulatory Visit (INDEPENDENT_AMBULATORY_CARE_PROVIDER_SITE_OTHER): Payer: Federal, State, Local not specified - PPO

## 2023-01-18 ENCOUNTER — Other Ambulatory Visit: Payer: Federal, State, Local not specified - PPO

## 2023-01-18 ENCOUNTER — Telehealth: Payer: Self-pay | Admitting: Internal Medicine

## 2023-01-18 DIAGNOSIS — E538 Deficiency of other specified B group vitamins: Secondary | ICD-10-CM

## 2023-01-18 DIAGNOSIS — R197 Diarrhea, unspecified: Secondary | ICD-10-CM | POA: Diagnosis not present

## 2023-01-18 DIAGNOSIS — Z8619 Personal history of other infectious and parasitic diseases: Secondary | ICD-10-CM | POA: Diagnosis not present

## 2023-01-18 MED ORDER — CYANOCOBALAMIN 1000 MCG/ML IJ SOLN
1000.0000 ug | Freq: Once | INTRAMUSCULAR | Status: AC
  Administered 2023-01-18: 1000 ug via INTRAMUSCULAR

## 2023-01-18 NOTE — Telephone Encounter (Signed)
Copied from CRM [id number]. Topic: Clinical - Medication Refill >> Jan 18, 2023 11:26 AM Elizebeth Brooking wrote: Most Recent Primary Care Visit:  Provider: Particia Nearing  Department: [facility]  Visit Type: NURSE VISIT  Date: 12/28/2022  Medication: cyanocobalamin (VITAMIN B12) 1000 MCG/ML injection  Has the patient contacted their pharmacy? Yes, pharmacy stated they are witing for the prescriber to approve it  (Agent: If no, request that the patient contact the pharmacy for the refill. If patient does not wish to contact the pharmacy document the reason why and proceed with request.) (Agent: If yes, when and what did the pharmacy advise?)  Is this the correct pharmacy for this prescription? Yes If no, delete pharmacy and type the correct one.  This is the patient's preferred pharmacy:  [id number] [place], [place] - [address] AT [address] [facility] [address] [place] [address] Phone: [phone] Fax: [phone]   Has the prescription been filled recently?   Is the patient out of the medication?   Has the patient been seen for an appointment in the last year OR does the patient have an upcoming appointment?   Can we respond through MyChart?   Agent: Please be advised that Rx refills may take up to 3 business days. We ask that you follow-up with your pharmacy.

## 2023-01-18 NOTE — Telephone Encounter (Signed)
Labs downstairs

## 2023-01-18 NOTE — Telephone Encounter (Signed)
Copied from CRM [id number]. Topic: General - Call Back - No Documentation >> Jan 18, 2023  2:08 PM Samuel Jester B wrote: Reason for CRM: Pt requested a call back to confirm where she would drop off her stool sample.

## 2023-01-18 NOTE — Telephone Encounter (Signed)
Sorry, I do not feel comfortable doing this, as I dont see a clear diagnosis of perniscious anemia requiring this to be done   And shots are not needed if B12 OTC oral is effective   thanks

## 2023-01-18 NOTE — Progress Notes (Signed)
After obtaining consent, and per orders of Dr. Sharlet Salina, injection of B12 given by Marrian Salvage. Patient instructed to report any adverse reaction to me immediately.

## 2023-01-19 DIAGNOSIS — H6983 Other specified disorders of Eustachian tube, bilateral: Secondary | ICD-10-CM | POA: Diagnosis not present

## 2023-01-19 DIAGNOSIS — H6503 Acute serous otitis media, bilateral: Secondary | ICD-10-CM | POA: Diagnosis not present

## 2023-01-19 LAB — CLOSTRIDIUM DIFFICILE BY PCR: Toxigenic C. Difficile by PCR: NEGATIVE

## 2023-01-20 LAB — GI PROFILE, STOOL, PCR

## 2023-01-25 NOTE — Telephone Encounter (Signed)
This was already sent to pharmacy beginning of December okay for her to fill. Should not need refill as this is only recommended for 4 doses.

## 2023-01-26 NOTE — Telephone Encounter (Signed)
Patient already filled this medication

## 2023-01-29 ENCOUNTER — Ambulatory Visit (INDEPENDENT_AMBULATORY_CARE_PROVIDER_SITE_OTHER): Payer: Federal, State, Local not specified - PPO

## 2023-01-29 DIAGNOSIS — E538 Deficiency of other specified B group vitamins: Secondary | ICD-10-CM

## 2023-01-29 MED ORDER — CYANOCOBALAMIN 1000 MCG/ML IJ SOLN
1000.0000 ug | Freq: Once | INTRAMUSCULAR | Status: AC
  Administered 2023-01-29: 1000 ug via INTRAMUSCULAR

## 2023-01-29 NOTE — Progress Notes (Signed)
 Patient here for weekly B12 injection per. B12 1000 mcg given in left IM and patient tolerated injection well today.

## 2023-02-03 DIAGNOSIS — N94810 Vulvar vestibulitis: Secondary | ICD-10-CM | POA: Diagnosis not present

## 2023-02-04 DIAGNOSIS — N94810 Vulvar vestibulitis: Secondary | ICD-10-CM | POA: Diagnosis not present

## 2023-02-05 ENCOUNTER — Ambulatory Visit: Payer: Federal, State, Local not specified - PPO

## 2023-02-05 ENCOUNTER — Ambulatory Visit (INDEPENDENT_AMBULATORY_CARE_PROVIDER_SITE_OTHER): Payer: Federal, State, Local not specified - PPO

## 2023-02-05 DIAGNOSIS — E538 Deficiency of other specified B group vitamins: Secondary | ICD-10-CM | POA: Diagnosis not present

## 2023-02-05 MED ORDER — CYANOCOBALAMIN 1000 MCG/ML IJ SOLN
1000.0000 ug | Freq: Once | INTRAMUSCULAR | Status: AC
  Administered 2023-02-05: 1000 ug via INTRAMUSCULAR

## 2023-02-05 NOTE — Progress Notes (Signed)
 Pt here for monthly B12 injection per   B12 given IM and pt tolerated injection well.  Patient responded well.

## 2023-02-08 NOTE — Telephone Encounter (Signed)
 Yes, after 4th injection oral 1000 mcg B12 daily for life over the counter. If she is still having symptoms we can recheck levels.

## 2023-03-03 ENCOUNTER — Encounter: Payer: Self-pay | Admitting: Internal Medicine

## 2023-03-03 NOTE — Telephone Encounter (Signed)
 We have not received this but do you have a fax number for them and I can fax over office notes also I would check with your insurance company regarding this

## 2023-03-08 ENCOUNTER — Ambulatory Visit: Payer: Federal, State, Local not specified - PPO

## 2023-03-15 DIAGNOSIS — N94810 Vulvar vestibulitis: Secondary | ICD-10-CM | POA: Diagnosis not present

## 2023-03-15 DIAGNOSIS — N94819 Vulvodynia, unspecified: Secondary | ICD-10-CM | POA: Diagnosis not present

## 2023-03-18 ENCOUNTER — Other Ambulatory Visit: Payer: Self-pay | Admitting: Internal Medicine

## 2023-04-23 DIAGNOSIS — N94819 Vulvodynia, unspecified: Secondary | ICD-10-CM | POA: Diagnosis not present

## 2023-05-24 DIAGNOSIS — B3731 Acute candidiasis of vulva and vagina: Secondary | ICD-10-CM | POA: Diagnosis not present

## 2023-05-24 DIAGNOSIS — N94810 Vulvar vestibulitis: Secondary | ICD-10-CM | POA: Diagnosis not present

## 2023-05-24 DIAGNOSIS — N94819 Vulvodynia, unspecified: Secondary | ICD-10-CM | POA: Diagnosis not present

## 2023-05-24 DIAGNOSIS — N898 Other specified noninflammatory disorders of vagina: Secondary | ICD-10-CM | POA: Diagnosis not present

## 2023-07-12 DIAGNOSIS — F411 Generalized anxiety disorder: Secondary | ICD-10-CM | POA: Diagnosis not present

## 2023-08-10 ENCOUNTER — Encounter: Payer: Self-pay | Admitting: Internal Medicine

## 2023-08-10 ENCOUNTER — Ambulatory Visit: Admitting: Internal Medicine

## 2023-08-10 VITALS — BP 100/80 | HR 88 | Temp 98.7°F | Ht 67.0 in | Wt 126.0 lb

## 2023-08-10 DIAGNOSIS — A0472 Enterocolitis due to Clostridium difficile, not specified as recurrent: Secondary | ICD-10-CM

## 2023-08-10 DIAGNOSIS — E538 Deficiency of other specified B group vitamins: Secondary | ICD-10-CM | POA: Diagnosis not present

## 2023-08-10 DIAGNOSIS — R5383 Other fatigue: Secondary | ICD-10-CM

## 2023-08-10 DIAGNOSIS — K582 Mixed irritable bowel syndrome: Secondary | ICD-10-CM

## 2023-08-10 DIAGNOSIS — E559 Vitamin D deficiency, unspecified: Secondary | ICD-10-CM | POA: Diagnosis not present

## 2023-08-10 LAB — COMPREHENSIVE METABOLIC PANEL WITH GFR
ALT: 13 U/L (ref 0–35)
AST: 15 U/L (ref 0–37)
Albumin: 3.9 g/dL (ref 3.5–5.2)
Alkaline Phosphatase: 35 U/L — ABNORMAL LOW (ref 39–117)
BUN: 11 mg/dL (ref 6–23)
CO2: 26 meq/L (ref 19–32)
Calcium: 9.1 mg/dL (ref 8.4–10.5)
Chloride: 102 meq/L (ref 96–112)
Creatinine, Ser: 0.78 mg/dL (ref 0.40–1.20)
GFR: 108.09 mL/min (ref >60.00)
Glucose, Bld: 98 mg/dL (ref 70–99)
Potassium: 3.7 meq/L (ref 3.5–5.1)
Sodium: 135 meq/L (ref 135–145)
Total Bilirubin: 0.5 mg/dL (ref 0.2–1.2)
Total Protein: 6.6 g/dL (ref 6.0–8.3)

## 2023-08-10 LAB — VITAMIN D 25 HYDROXY (VIT D DEFICIENCY, FRACTURES): VITD: 24.75 ng/mL — ABNORMAL LOW (ref 30.00–100.00)

## 2023-08-10 LAB — CBC
HCT: 38.9 % (ref 36.0–46.0)
Hemoglobin: 12.9 g/dL (ref 12.0–15.0)
MCHC: 33.2 g/dL (ref 30.0–36.0)
MCV: 84.5 fl (ref 78.0–100.0)
Platelets: 327 K/uL (ref 150.0–400.0)
RBC: 4.6 Mil/uL (ref 3.87–5.11)
RDW: 13.1 % (ref 11.5–15.5)
WBC: 5.6 K/uL (ref 4.0–10.5)

## 2023-08-10 LAB — FERRITIN: Ferritin: 20.8 ng/mL (ref 10.0–291.0)

## 2023-08-10 LAB — VITAMIN B12: Vitamin B-12: 1023 pg/mL — ABNORMAL HIGH (ref 211–911)

## 2023-08-10 MED ORDER — ONDANSETRON HCL 4 MG PO TABS: 4.0000 mg | ORAL_TABLET | Freq: Three times a day (TID) | ORAL | 0 refills | Status: AC | PRN

## 2023-08-10 NOTE — Assessment & Plan Note (Signed)
 Checking vitamin D  level and did high dose replacement beginning of this year.

## 2023-08-10 NOTE — Assessment & Plan Note (Signed)
 With worsening pain after eating and is going to see another GI for opinion and treatment. Refilled zofran  which she needs for nausea at times.

## 2023-08-10 NOTE — Assessment & Plan Note (Signed)
 Suspect related to absorption with GI issues. Did replacement and taking oral 1000 mcg daily. Checking B12 and if low may need chronic IM replacement.

## 2023-08-10 NOTE — Patient Instructions (Signed)
 We will check the labs and if normal we will check a sleep test.

## 2023-08-10 NOTE — Assessment & Plan Note (Signed)
 Checking CBC, CMP, ferritin. Adjust as needed. If normal may need sleep test.

## 2023-08-10 NOTE — Progress Notes (Signed)
   Subjective:   Patient ID: Alison Terry, female    DOB: March 29, 2000, 23 y.o.   MRN: [id number]  HPI The patient is a 23 YO female coming in for fatigue. Last visit vitamin d  and b12 were low. She did take replacement and felt better for a short while. GI issues are moderate at least and worsening lately.   Review of Systems  Constitutional:  Positive for activity change and fatigue. Negative for appetite change.  HENT: Negative.    Eyes: Negative.   Respiratory:  Negative for cough, chest tightness and shortness of breath.   Cardiovascular:  Negative for chest pain, palpitations and leg swelling.  Gastrointestinal:  Positive for abdominal pain, diarrhea and nausea. Negative for abdominal distention, constipation and vomiting.  Musculoskeletal: Negative.   Skin: Negative.   Neurological: Negative.   Psychiatric/Behavioral: Negative.      Objective:  Physical Exam Constitutional:      Appearance: She is well-developed.  HENT:     Head: Normocephalic and atraumatic.  Cardiovascular:     Rate and Rhythm: Normal rate and regular rhythm.  Pulmonary:     Effort: Pulmonary effort is normal. No respiratory distress.     Breath sounds: Normal breath sounds. No wheezing or rales.  Abdominal:     General: Bowel sounds are normal. There is no distension.     Palpations: Abdomen is soft.     Tenderness: There is no abdominal tenderness. There is no rebound.  Musculoskeletal:     Cervical back: Normal range of motion.  Skin:    General: Skin is warm and dry.  Neurological:     Mental Status: She is alert and oriented to person, place, and time.     Coordination: Coordination normal.     Vitals:   08/10/23 0848  BP: 100/80  Pulse: 88  Temp: 98.7 F (37.1 C)  TempSrc: Oral  SpO2: 98%  Weight: 126 lb (57.2 kg)  Height: 5' 7 (1.702 m)    Assessment & Plan:

## 2023-08-12 ENCOUNTER — Ambulatory Visit: Payer: Self-pay | Admitting: Internal Medicine

## 2023-08-27 DIAGNOSIS — R278 Other lack of coordination: Secondary | ICD-10-CM | POA: Diagnosis not present

## 2023-08-27 DIAGNOSIS — R3912 Poor urinary stream: Secondary | ICD-10-CM | POA: Diagnosis not present

## 2023-08-27 DIAGNOSIS — M6281 Muscle weakness (generalized): Secondary | ICD-10-CM | POA: Diagnosis not present

## 2023-08-27 DIAGNOSIS — N94819 Vulvodynia, unspecified: Secondary | ICD-10-CM | POA: Diagnosis not present

## 2023-08-30 DIAGNOSIS — N94819 Vulvodynia, unspecified: Secondary | ICD-10-CM | POA: Diagnosis not present

## 2023-08-30 DIAGNOSIS — M6281 Muscle weakness (generalized): Secondary | ICD-10-CM | POA: Diagnosis not present

## 2023-08-30 DIAGNOSIS — R3912 Poor urinary stream: Secondary | ICD-10-CM | POA: Diagnosis not present

## 2023-08-30 DIAGNOSIS — R278 Other lack of coordination: Secondary | ICD-10-CM | POA: Diagnosis not present

## 2023-09-03 DIAGNOSIS — R3912 Poor urinary stream: Secondary | ICD-10-CM | POA: Diagnosis not present

## 2023-09-03 DIAGNOSIS — N94819 Vulvodynia, unspecified: Secondary | ICD-10-CM | POA: Diagnosis not present

## 2023-09-03 DIAGNOSIS — M6281 Muscle weakness (generalized): Secondary | ICD-10-CM | POA: Diagnosis not present

## 2023-09-03 DIAGNOSIS — R278 Other lack of coordination: Secondary | ICD-10-CM | POA: Diagnosis not present

## 2023-09-13 DIAGNOSIS — R278 Other lack of coordination: Secondary | ICD-10-CM | POA: Diagnosis not present

## 2023-09-13 DIAGNOSIS — M6281 Muscle weakness (generalized): Secondary | ICD-10-CM | POA: Diagnosis not present

## 2023-09-13 DIAGNOSIS — R3912 Poor urinary stream: Secondary | ICD-10-CM | POA: Diagnosis not present

## 2023-09-13 DIAGNOSIS — N94819 Vulvodynia, unspecified: Secondary | ICD-10-CM | POA: Diagnosis not present

## 2023-09-16 ENCOUNTER — Encounter: Payer: Self-pay | Admitting: Internal Medicine

## 2023-09-17 DIAGNOSIS — R399 Unspecified symptoms and signs involving the genitourinary system: Secondary | ICD-10-CM | POA: Diagnosis not present

## 2023-09-17 DIAGNOSIS — R300 Dysuria: Secondary | ICD-10-CM | POA: Diagnosis not present

## 2023-09-17 DIAGNOSIS — J029 Acute pharyngitis, unspecified: Secondary | ICD-10-CM | POA: Diagnosis not present

## 2023-10-11 DIAGNOSIS — F429 Obsessive-compulsive disorder, unspecified: Secondary | ICD-10-CM | POA: Diagnosis not present

## 2023-10-11 DIAGNOSIS — F411 Generalized anxiety disorder: Secondary | ICD-10-CM | POA: Diagnosis not present

## 2023-10-11 DIAGNOSIS — R21 Rash and other nonspecific skin eruption: Secondary | ICD-10-CM | POA: Diagnosis not present

## 2023-11-29 DIAGNOSIS — Z1330 Encounter for screening examination for mental health and behavioral disorders, unspecified: Secondary | ICD-10-CM | POA: Diagnosis not present

## 2023-11-29 DIAGNOSIS — Z01419 Encounter for gynecological examination (general) (routine) without abnormal findings: Secondary | ICD-10-CM | POA: Diagnosis not present

## 2023-12-17 DIAGNOSIS — M62838 Other muscle spasm: Secondary | ICD-10-CM | POA: Diagnosis not present

## 2023-12-17 DIAGNOSIS — N941 Unspecified dyspareunia: Secondary | ICD-10-CM | POA: Diagnosis not present

## 2023-12-28 DIAGNOSIS — K5909 Other constipation: Secondary | ICD-10-CM | POA: Diagnosis not present

## 2023-12-28 DIAGNOSIS — M62838 Other muscle spasm: Secondary | ICD-10-CM | POA: Diagnosis not present

## 2023-12-28 DIAGNOSIS — N941 Unspecified dyspareunia: Secondary | ICD-10-CM | POA: Diagnosis not present

## 2023-12-28 DIAGNOSIS — R278 Other lack of coordination: Secondary | ICD-10-CM | POA: Diagnosis not present

## 2023-12-31 DIAGNOSIS — M62838 Other muscle spasm: Secondary | ICD-10-CM | POA: Diagnosis not present

## 2023-12-31 DIAGNOSIS — K5909 Other constipation: Secondary | ICD-10-CM | POA: Diagnosis not present

## 2023-12-31 DIAGNOSIS — N94819 Vulvodynia, unspecified: Secondary | ICD-10-CM | POA: Diagnosis not present

## 2023-12-31 DIAGNOSIS — N941 Unspecified dyspareunia: Secondary | ICD-10-CM | POA: Diagnosis not present

## 2023-12-31 DIAGNOSIS — R278 Other lack of coordination: Secondary | ICD-10-CM | POA: Diagnosis not present

## 2024-01-07 DIAGNOSIS — R278 Other lack of coordination: Secondary | ICD-10-CM | POA: Diagnosis not present

## 2024-01-07 DIAGNOSIS — N941 Unspecified dyspareunia: Secondary | ICD-10-CM | POA: Diagnosis not present

## 2024-01-07 DIAGNOSIS — K5909 Other constipation: Secondary | ICD-10-CM | POA: Diagnosis not present

## 2024-01-07 DIAGNOSIS — M62838 Other muscle spasm: Secondary | ICD-10-CM | POA: Diagnosis not present

## 2024-01-07 DIAGNOSIS — F411 Generalized anxiety disorder: Secondary | ICD-10-CM | POA: Diagnosis not present

## 2024-01-07 DIAGNOSIS — F429 Obsessive-compulsive disorder, unspecified: Secondary | ICD-10-CM | POA: Diagnosis not present

## 2024-01-10 DIAGNOSIS — N941 Unspecified dyspareunia: Secondary | ICD-10-CM | POA: Diagnosis not present

## 2024-01-10 DIAGNOSIS — K5909 Other constipation: Secondary | ICD-10-CM | POA: Diagnosis not present

## 2024-01-10 DIAGNOSIS — M62838 Other muscle spasm: Secondary | ICD-10-CM | POA: Diagnosis not present

## 2024-01-10 DIAGNOSIS — R278 Other lack of coordination: Secondary | ICD-10-CM | POA: Diagnosis not present
